# Patient Record
Sex: Female | Born: 1950 | Race: White | Hispanic: No | Marital: Married | Smoking: Never smoker
Health system: Southern US, Community
[De-identification: ages and names within clinical notes are randomized; demographics above are authoritative.]

## PROBLEM LIST (undated history)

## (undated) HISTORY — PX: ABDOMINAL HYSTERECTOMY: SHX81

---

## 2000-10-20 ENCOUNTER — Other Ambulatory Visit: Admission: RE | Admit: 2000-10-20 | Discharge: 2000-10-20 | Payer: Self-pay | Admitting: Obstetrics & Gynecology

## 2005-01-28 ENCOUNTER — Other Ambulatory Visit: Admission: RE | Admit: 2005-01-28 | Discharge: 2005-01-28 | Payer: Self-pay | Admitting: Obstetrics & Gynecology

## 2005-03-24 ENCOUNTER — Ambulatory Visit: Payer: Self-pay | Admitting: Cardiology

## 2011-10-12 ENCOUNTER — Other Ambulatory Visit: Payer: Self-pay | Admitting: Obstetrics & Gynecology

## 2011-10-12 DIAGNOSIS — R928 Other abnormal and inconclusive findings on diagnostic imaging of breast: Secondary | ICD-10-CM

## 2011-10-15 ENCOUNTER — Ambulatory Visit
Admission: RE | Admit: 2011-10-15 | Discharge: 2011-10-15 | Disposition: A | Discharge status: Home / Self Care | Payer: BC Managed Care – PPO | Source: Ambulatory Visit | Attending: Obstetrics & Gynecology | Admitting: Obstetrics & Gynecology

## 2011-10-15 DIAGNOSIS — R928 Other abnormal and inconclusive findings on diagnostic imaging of breast: Secondary | ICD-10-CM

## 2013-01-22 ENCOUNTER — Other Ambulatory Visit: Payer: Self-pay | Admitting: Obstetrics & Gynecology

## 2013-01-22 DIAGNOSIS — R928 Other abnormal and inconclusive findings on diagnostic imaging of breast: Secondary | ICD-10-CM

## 2013-01-31 ENCOUNTER — Ambulatory Visit
Admission: RE | Admit: 2013-01-31 | Discharge: 2013-01-31 | Disposition: A | Discharge status: Home / Self Care | Payer: BC Managed Care – PPO | Source: Ambulatory Visit | Attending: Obstetrics & Gynecology | Admitting: Obstetrics & Gynecology

## 2013-01-31 DIAGNOSIS — R928 Other abnormal and inconclusive findings on diagnostic imaging of breast: Secondary | ICD-10-CM

## 2016-05-26 ENCOUNTER — Other Ambulatory Visit: Payer: Self-pay | Admitting: Obstetrics & Gynecology

## 2016-05-26 DIAGNOSIS — R928 Other abnormal and inconclusive findings on diagnostic imaging of breast: Secondary | ICD-10-CM

## 2016-06-15 ENCOUNTER — Ambulatory Visit
Admission: RE | Admit: 2016-06-15 | Discharge: 2016-06-15 | Disposition: A | Discharge status: Home / Self Care | Payer: 59 | Source: Ambulatory Visit | Attending: Obstetrics & Gynecology | Admitting: Obstetrics & Gynecology

## 2016-06-15 DIAGNOSIS — R928 Other abnormal and inconclusive findings on diagnostic imaging of breast: Secondary | ICD-10-CM

## 2018-01-16 IMAGING — US ULTRASOUND RIGHT BREAST LIMITED
1 series · 5 of 5 positions shown · non-contrast
Comparison: Previous exam(s).

CLINICAL DATA: Right breast slightly upper inner quadrant focal
asymmetry seen on most recent screening mammography.

EXAM:
2D DIGITAL DIAGNOSTIC RIGHT MAMMOGRAM WITH CAD AND ADJUNCT TOMO
ULTRASOUND RIGHT BREAST

[Series 1: ultrasound right breast limited · 0.07mm/px · 5 of 5 slices shown]
[im 1/5]
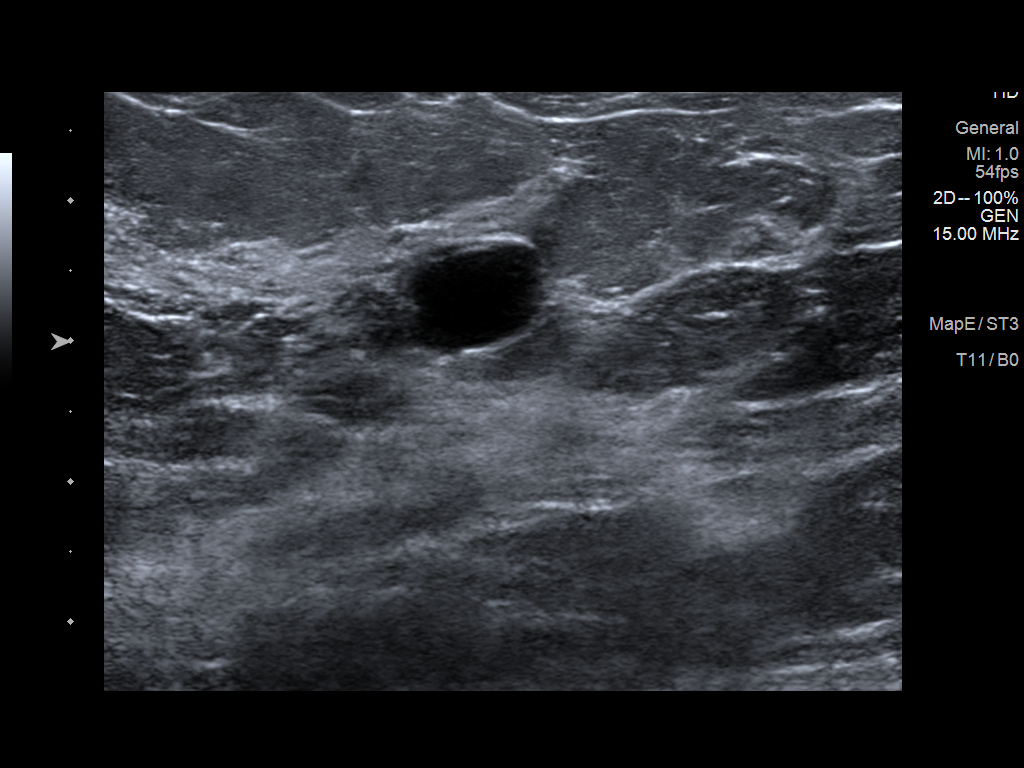
[im 2/5]
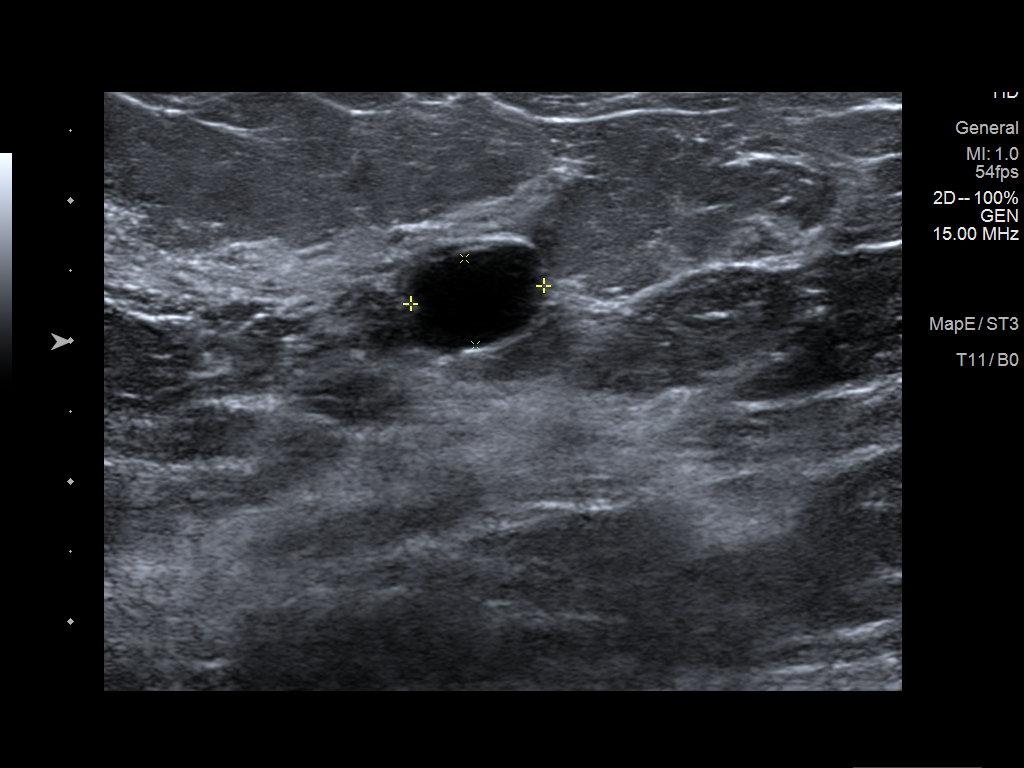
[im 3/5]
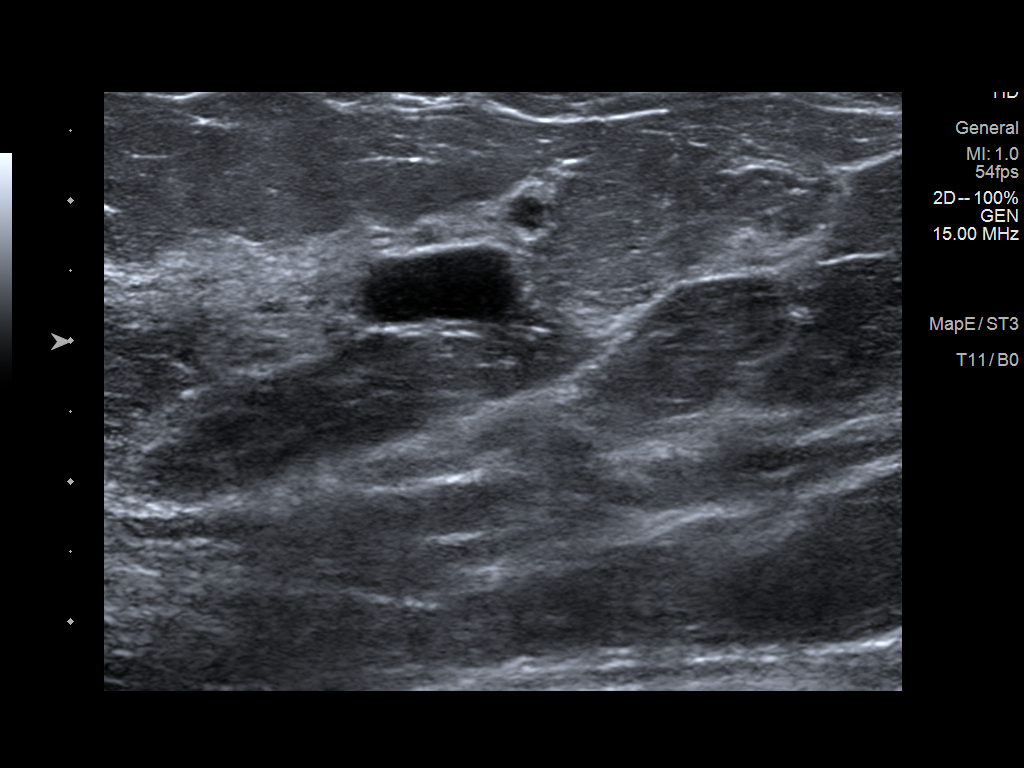
[im 4/5]
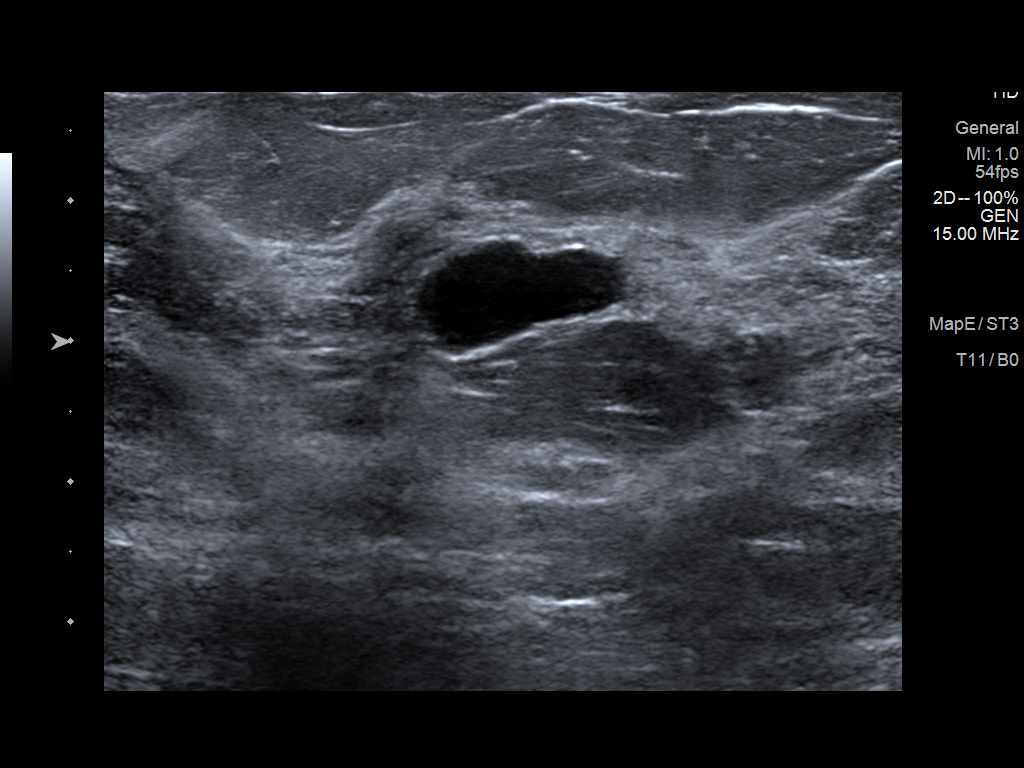
[im 5/5]
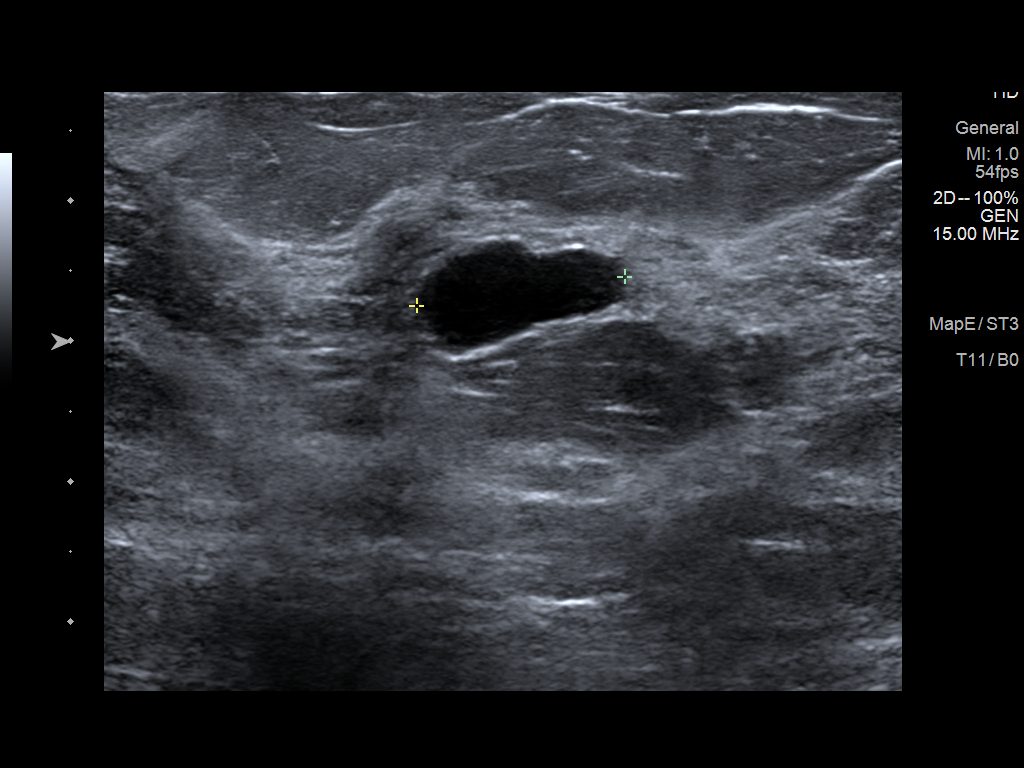

[5 of 5 positions shown; findings below may reference images not displayed]

ACR Breast Density Category c: The breast tissue is heterogeneously
dense, which may obscure small masses.
FINDINGS: Additional mammographic views of the right breast demonstrate
persistent macro lobulated circumscribed low-density mass in the
right 1 o'clock breast, middle depth. The mammographic appearance
suggestive of a benign finding.

Mammographic images were processed with CAD.

On physical exam, no suspicious masses are found.

Targeted ultrasound is performed, showing right breast 1 o'clock 2
cm from the nipple benign-appearing cyst measuring 1.5 x 1.0 x
cm. This finding is felt to correspond to the mammographically seen
mass.
IMPRESSION: No mammographic evidence of malignancy in the right breast.

Right breast 1 o'clock benign-appearing cyst.

RECOMMENDATION:
Screening mammogram in one year.(Code:JO-D-8E1)

I have discussed the findings and recommendations with the patient.
Results were also provided in writing at the conclusion of the
visit. If applicable, a reminder letter will be sent to the patient
regarding the next appointment.

BI-RADS CATEGORY  2: Benign.

## 2018-01-16 IMAGING — MG 2D DIGITAL DIAGNOSTIC UNILATERAL RIGHT MAMMOGRAM WITH CAD AND AD
5 series · 6 of 13 positions shown · non-contrast
Comparison: Previous exam(s).

CLINICAL DATA: Right breast slightly upper inner quadrant focal
asymmetry seen on most recent screening mammography.

EXAM:
2D DIGITAL DIAGNOSTIC RIGHT MAMMOGRAM WITH CAD AND ADJUNCT TOMO
ULTRASOUND RIGHT BREAST

[R CC]
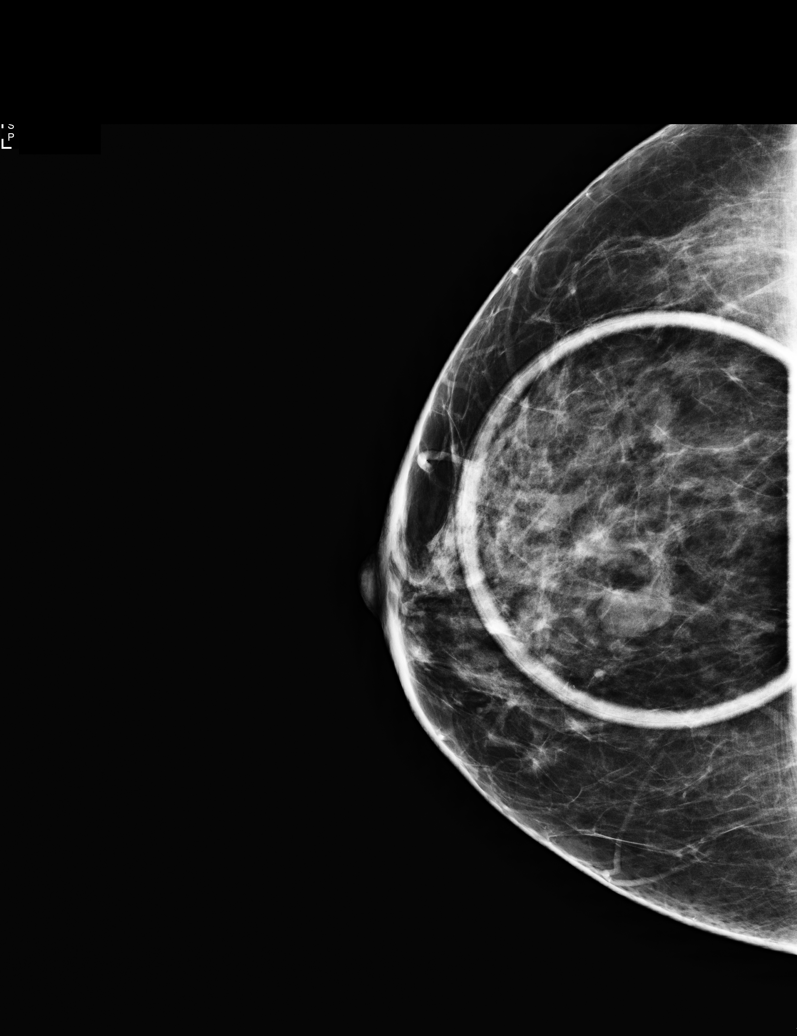

[R CC synth-2D]
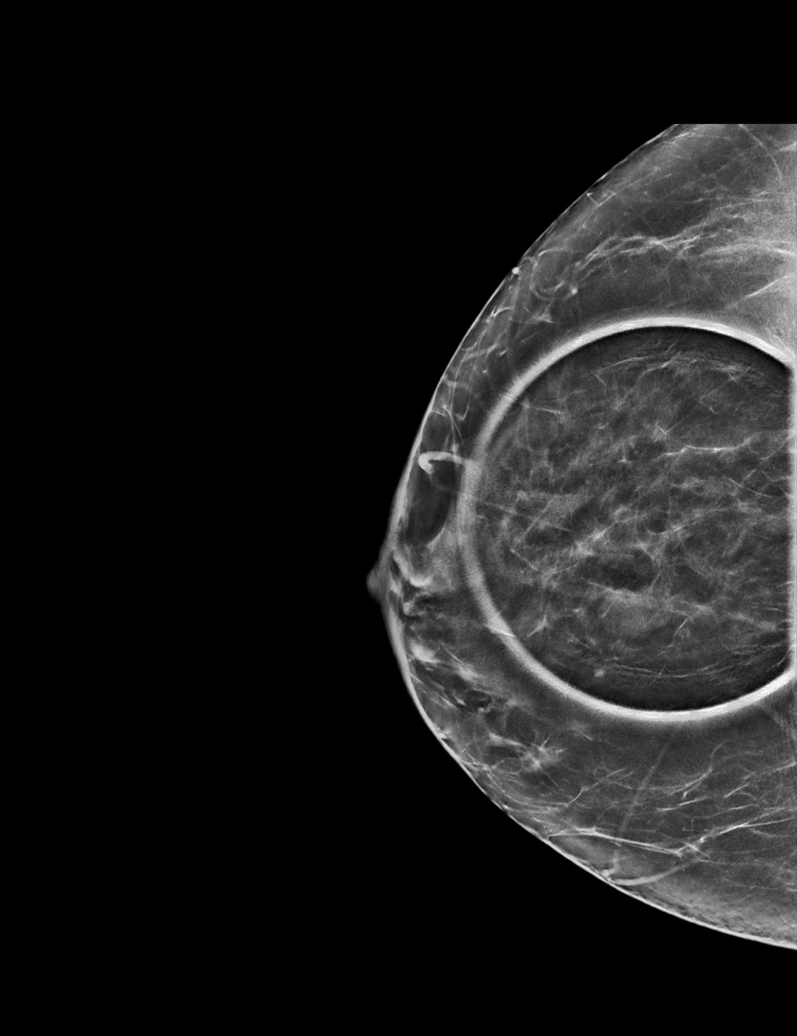

[R MLO]
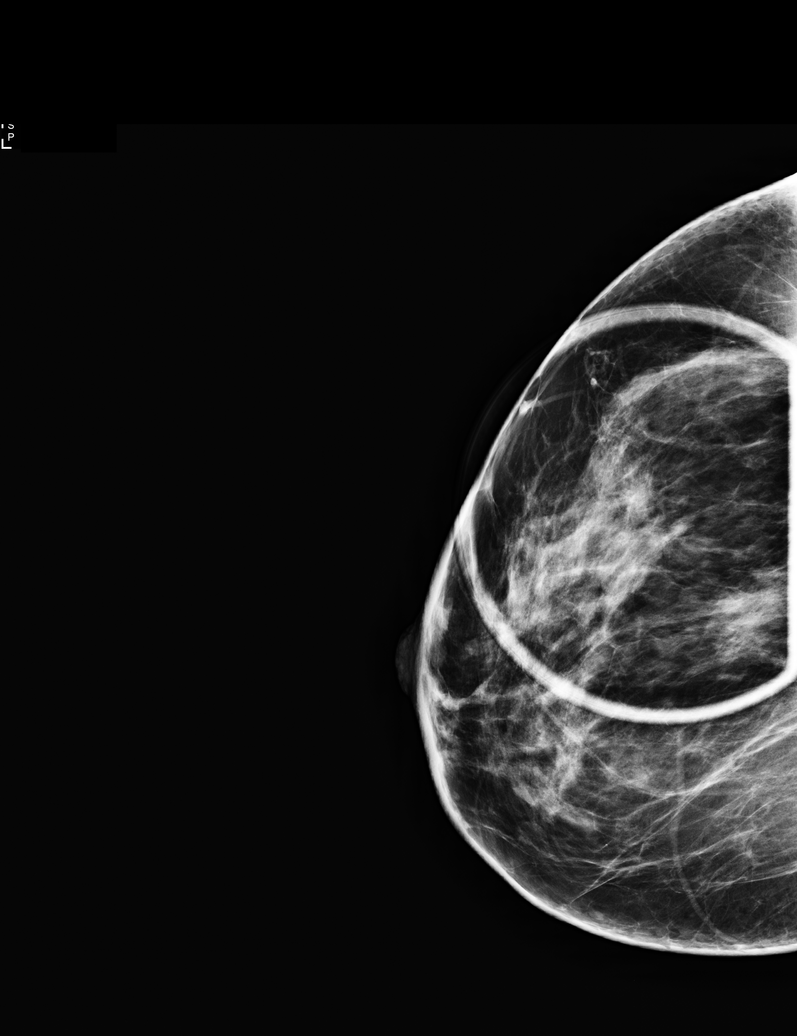

[R CC tomo · 2 of 63 frames shown]
[frame 21/63]
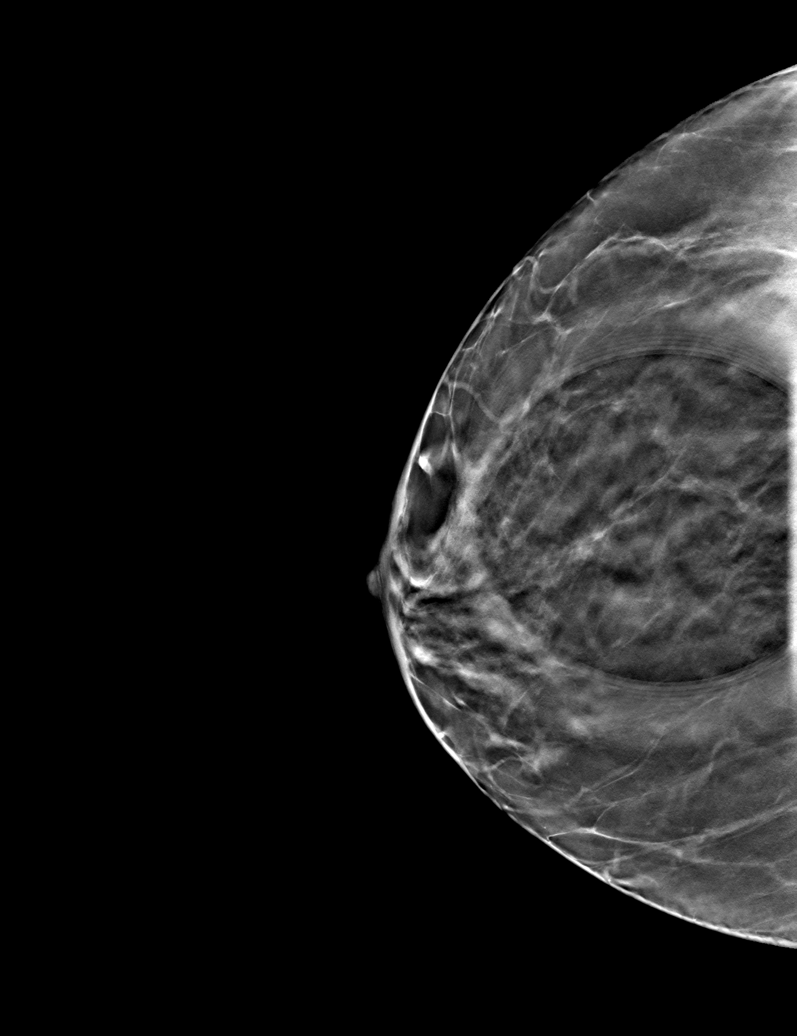
[frame 32/63]
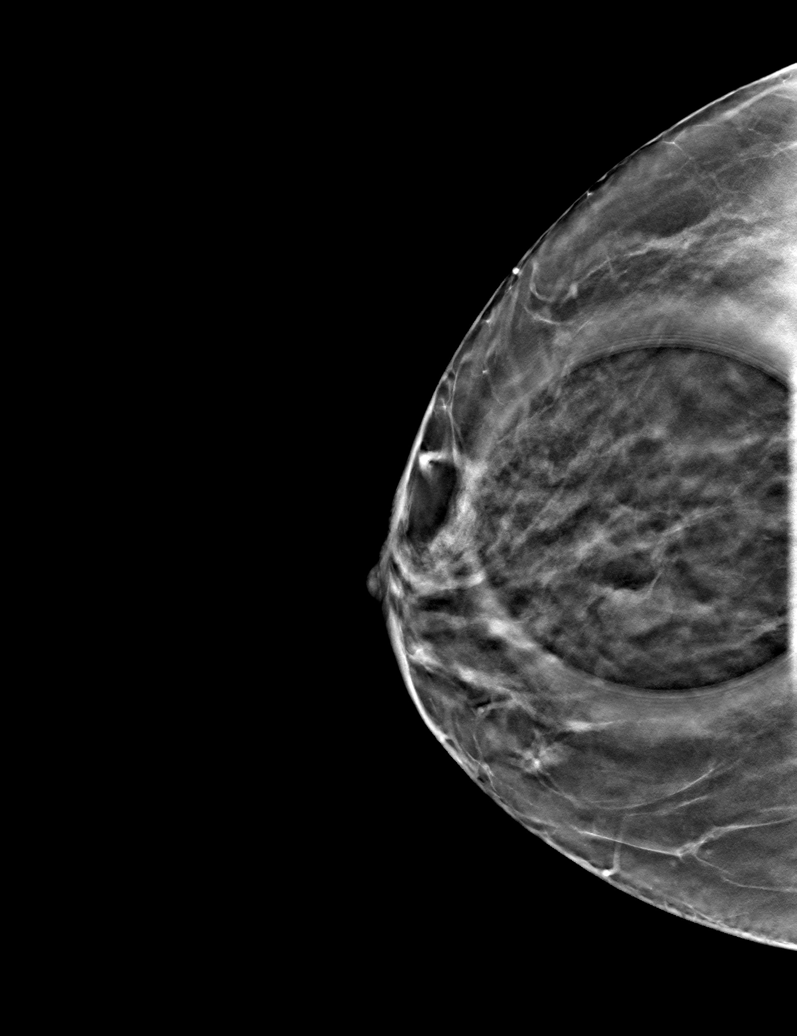

[R MLO tomo · tomo slice 35/68.0]
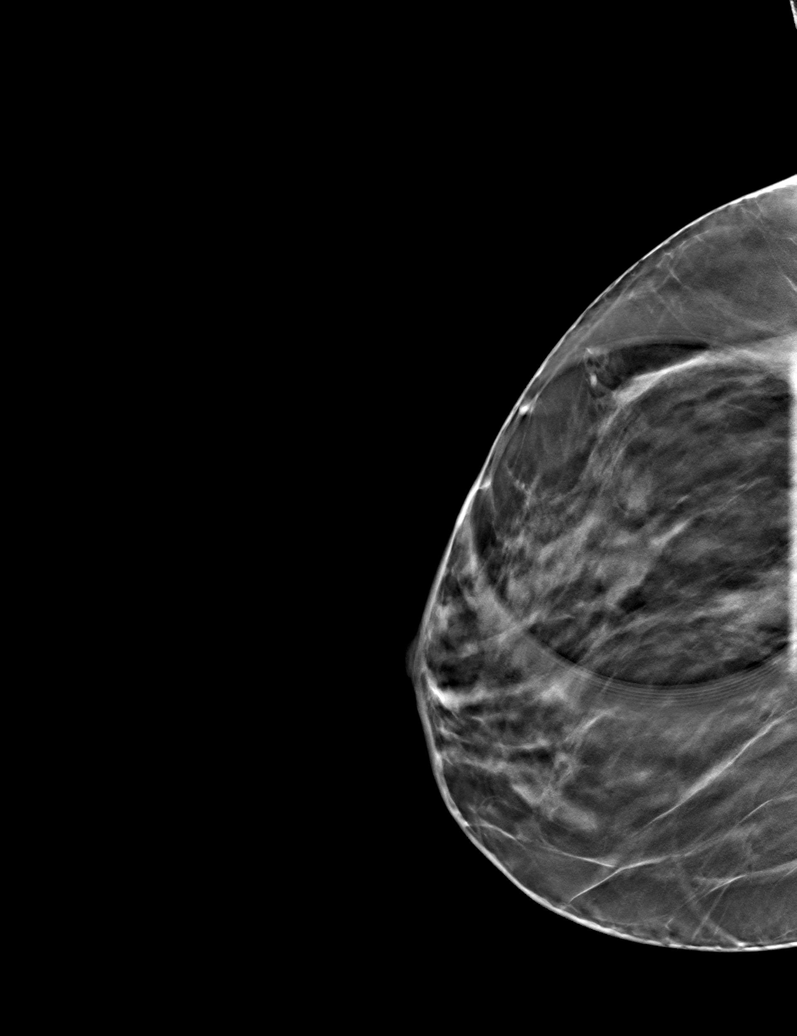

[6 of 13 positions shown; findings below may reference images not displayed]

ACR Breast Density Category c: The breast tissue is heterogeneously
dense, which may obscure small masses.
FINDINGS: Additional mammographic views of the right breast demonstrate
persistent macro lobulated circumscribed low-density mass in the
right 1 o'clock breast, middle depth. The mammographic appearance
suggestive of a benign finding.

Mammographic images were processed with CAD.

On physical exam, no suspicious masses are found.

Targeted ultrasound is performed, showing right breast 1 o'clock 2
cm from the nipple benign-appearing cyst measuring 1.5 x 1.0 x
cm. This finding is felt to correspond to the mammographically seen
mass.
IMPRESSION: No mammographic evidence of malignancy in the right breast.

Right breast 1 o'clock benign-appearing cyst.

RECOMMENDATION:
Screening mammogram in one year.(Code:JO-D-8E1)

I have discussed the findings and recommendations with the patient.
Results were also provided in writing at the conclusion of the
visit. If applicable, a reminder letter will be sent to the patient
regarding the next appointment.

BI-RADS CATEGORY  2: Benign.

## 2020-05-05 DIAGNOSIS — I10 Essential (primary) hypertension: Secondary | ICD-10-CM

## 2020-05-05 HISTORY — DX: Essential (primary) hypertension: I10

## 2020-06-03 DIAGNOSIS — I10 Essential (primary) hypertension: Secondary | ICD-10-CM | POA: Diagnosis not present

## 2020-06-03 DIAGNOSIS — R9431 Abnormal electrocardiogram [ECG] [EKG]: Secondary | ICD-10-CM | POA: Diagnosis not present

## 2021-10-14 ENCOUNTER — Ambulatory Visit: Payer: Self-pay | Admitting: Student

## 2021-12-22 DIAGNOSIS — R799 Abnormal finding of blood chemistry, unspecified: Secondary | ICD-10-CM | POA: Diagnosis not present

## 2021-12-22 DIAGNOSIS — Z131 Encounter for screening for diabetes mellitus: Secondary | ICD-10-CM | POA: Diagnosis not present

## 2021-12-22 DIAGNOSIS — L309 Dermatitis, unspecified: Secondary | ICD-10-CM | POA: Diagnosis not present

## 2021-12-22 DIAGNOSIS — R7301 Impaired fasting glucose: Secondary | ICD-10-CM | POA: Diagnosis not present

## 2021-12-22 DIAGNOSIS — I10 Essential (primary) hypertension: Secondary | ICD-10-CM | POA: Diagnosis not present

## 2021-12-22 DIAGNOSIS — E559 Vitamin D deficiency, unspecified: Secondary | ICD-10-CM | POA: Diagnosis not present

## 2021-12-22 DIAGNOSIS — E785 Hyperlipidemia, unspecified: Secondary | ICD-10-CM | POA: Diagnosis not present

## 2022-01-14 DIAGNOSIS — E785 Hyperlipidemia, unspecified: Secondary | ICD-10-CM | POA: Diagnosis not present

## 2022-01-14 DIAGNOSIS — I951 Orthostatic hypotension: Secondary | ICD-10-CM | POA: Diagnosis not present

## 2022-01-14 DIAGNOSIS — Z008 Encounter for other general examination: Secondary | ICD-10-CM | POA: Diagnosis not present

## 2022-01-14 DIAGNOSIS — Z803 Family history of malignant neoplasm of breast: Secondary | ICD-10-CM | POA: Diagnosis not present

## 2022-01-14 DIAGNOSIS — R32 Unspecified urinary incontinence: Secondary | ICD-10-CM | POA: Diagnosis not present

## 2022-01-14 DIAGNOSIS — I10 Essential (primary) hypertension: Secondary | ICD-10-CM | POA: Diagnosis not present

## 2022-01-14 DIAGNOSIS — Z7982 Long term (current) use of aspirin: Secondary | ICD-10-CM | POA: Diagnosis not present

## 2022-01-14 DIAGNOSIS — Z8249 Family history of ischemic heart disease and other diseases of the circulatory system: Secondary | ICD-10-CM | POA: Diagnosis not present

## 2022-02-23 DIAGNOSIS — J111 Influenza due to unidentified influenza virus with other respiratory manifestations: Secondary | ICD-10-CM | POA: Diagnosis not present

## 2022-03-29 DIAGNOSIS — Z1212 Encounter for screening for malignant neoplasm of rectum: Secondary | ICD-10-CM | POA: Diagnosis not present

## 2022-03-29 DIAGNOSIS — Z1211 Encounter for screening for malignant neoplasm of colon: Secondary | ICD-10-CM | POA: Diagnosis not present

## 2022-04-07 LAB — COLOGUARD: COLOGUARD: POSITIVE — AB

## 2022-04-16 DIAGNOSIS — R195 Other fecal abnormalities: Secondary | ICD-10-CM | POA: Diagnosis not present

## 2022-05-14 DIAGNOSIS — R195 Other fecal abnormalities: Secondary | ICD-10-CM | POA: Diagnosis not present

## 2022-05-14 DIAGNOSIS — Z1211 Encounter for screening for malignant neoplasm of colon: Secondary | ICD-10-CM | POA: Diagnosis not present

## 2022-08-12 DIAGNOSIS — Z13228 Encounter for screening for other metabolic disorders: Secondary | ICD-10-CM | POA: Diagnosis not present

## 2022-08-12 DIAGNOSIS — R9431 Abnormal electrocardiogram [ECG] [EKG]: Secondary | ICD-10-CM | POA: Diagnosis not present

## 2022-08-12 DIAGNOSIS — H6122 Impacted cerumen, left ear: Secondary | ICD-10-CM | POA: Diagnosis not present

## 2022-08-12 DIAGNOSIS — I10 Essential (primary) hypertension: Secondary | ICD-10-CM | POA: Diagnosis not present

## 2022-08-12 DIAGNOSIS — I8393 Asymptomatic varicose veins of bilateral lower extremities: Secondary | ICD-10-CM | POA: Diagnosis not present

## 2022-08-12 DIAGNOSIS — Z0001 Encounter for general adult medical examination with abnormal findings: Secondary | ICD-10-CM | POA: Diagnosis not present

## 2022-08-12 DIAGNOSIS — Z1321 Encounter for screening for nutritional disorder: Secondary | ICD-10-CM | POA: Diagnosis not present

## 2022-08-12 DIAGNOSIS — Z136 Encounter for screening for cardiovascular disorders: Secondary | ICD-10-CM | POA: Diagnosis not present

## 2022-08-12 DIAGNOSIS — E785 Hyperlipidemia, unspecified: Secondary | ICD-10-CM | POA: Diagnosis not present

## 2022-08-12 DIAGNOSIS — E559 Vitamin D deficiency, unspecified: Secondary | ICD-10-CM | POA: Diagnosis not present

## 2022-08-17 DIAGNOSIS — R9431 Abnormal electrocardiogram [ECG] [EKG]: Secondary | ICD-10-CM | POA: Diagnosis not present

## 2022-08-17 DIAGNOSIS — I10 Essential (primary) hypertension: Secondary | ICD-10-CM | POA: Diagnosis not present

## 2022-08-26 ENCOUNTER — Other Ambulatory Visit: Payer: Self-pay

## 2022-08-27 ENCOUNTER — Encounter: Payer: Self-pay | Admitting: Cardiology

## 2022-08-27 ENCOUNTER — Ambulatory Visit: Payer: Medicare HMO | Attending: Cardiology | Admitting: Cardiology

## 2022-08-27 VITALS — BP 128/70 | HR 93 | Ht 68.0 in | Wt 172.4 lb

## 2022-08-27 DIAGNOSIS — E785 Hyperlipidemia, unspecified: Secondary | ICD-10-CM | POA: Diagnosis not present

## 2022-08-27 DIAGNOSIS — I10 Essential (primary) hypertension: Secondary | ICD-10-CM

## 2022-08-27 DIAGNOSIS — R9431 Abnormal electrocardiogram [ECG] [EKG]: Secondary | ICD-10-CM | POA: Insufficient documentation

## 2022-08-27 DIAGNOSIS — I351 Nonrheumatic aortic (valve) insufficiency: Secondary | ICD-10-CM | POA: Insufficient documentation

## 2022-08-27 DIAGNOSIS — Z8249 Family history of ischemic heart disease and other diseases of the circulatory system: Secondary | ICD-10-CM | POA: Diagnosis not present

## 2022-08-27 DIAGNOSIS — I6521 Occlusion and stenosis of right carotid artery: Secondary | ICD-10-CM | POA: Diagnosis not present

## 2022-08-27 DIAGNOSIS — I779 Disorder of arteries and arterioles, unspecified: Secondary | ICD-10-CM

## 2022-08-27 HISTORY — DX: Disorder of arteries and arterioles, unspecified: I77.9

## 2022-08-27 HISTORY — DX: Abnormal electrocardiogram (ECG) (EKG): R94.31

## 2022-08-27 HISTORY — DX: Nonrheumatic aortic (valve) insufficiency: I35.1

## 2022-08-27 MED ORDER — ATORVASTATIN CALCIUM 20 MG PO TABS: 20.0000 mg | ORAL_TABLET | Freq: Every day | ORAL | 3 refills | Status: DC

## 2022-08-27 NOTE — Patient Instructions (Signed)
Medication Instructions:  Your physician has recommended you make the following change in your medication:   INCREASE:  Lipitor 20mg  1 tablet daily- You may double your current dose then your next refill will reflect your new dose.     Lab Work: Your physician recommends that you return for lab work in: 6 weeks You need to have labs done when you are fasting.  You can come Monday through Friday 8:30 am to 12:00 pm and 1:15 to 4:30. You do not need to make an appointment as the order has already been placed. The labs you are going to have done are AST, ALT Lipids.    Testing/Procedures: We will order CT coronary calcium score. It will cost $99.00 and is not covered by insurance.  Please call to schedule.     [facility] [address] [place], [place] [address] [phone]  -- Call to schedule    Follow-Up: At [facility], you and your health needs are our priority.  As part of our continuing mission to provide you with exceptional heart care, we have created designated Provider Care Teams.  These Care Teams include your primary Cardiologist (physician) and Advanced Practice Providers (APPs -  Physician Assistants and Nurse Practitioners) who all work together to provide you with the care you need, when you need it.  We recommend signing up for the patient portal called "MyChart".  Sign up information is provided on this After Visit Summary.  MyChart is used to connect with patients for Virtual Visits (Telemedicine).  Patients are able to view lab/test results, encounter notes, upcoming appointments, etc.  Non-urgent messages can be sent to your provider as well.   To learn more about what you can do with MyChart, go to [email or web address].    Your next appointment:   3 month(s)  The format for your next appointment:   In Person  Provider:   Jenne Campus, MD    Other Instructions NA

## 2022-08-27 NOTE — Progress Notes (Signed)
Cardiology Consultation:    Date:  08/27/2022   ID:  Janice Randolph, DOB December 15, 1950, MRN [id number]  PCP:  Erskine Emery, NP  Cardiologist:  Gypsy Balsam, MD   Referring MD: Lars Mage, NP   Chief Complaint  Patient presents with   Abnormal ECG   Abnormal [place]    History of Present Illness:    Janice Randolph is a 71 y.o. female who is being seen today for the evaluation of abnormal EKG, abnormal echocardiogram, abnormal cardiac ultrasound at the request of Tetter, Devin B, NP.  She went to her primary care physician for regular checkup.  Supportive evaluation she had cardiac ultrasound done which showed up to 69% stenosis on the right side, she also had echocardiogram performed which showed preserved left ventricle ejection fraction without segmental motion of normalities, no significant valvular pathology, will treat if that echocardiogram is the fact that she had an EKG done which showed possibility of anterior wall MI as well as possibility of inferior wall MI.  Also few years ago she 2 years ago she was told to have a stroke I plan to get CT of her head done after some strength events she was told to have a small stroke.  At that time she was put on statin as well as aspirin.  Overall she is doing very well.  She can walk climb stairs with no difficulties she denies having any chest pain tightness squeezing pressure burning chest no palpitations no dizziness no swelling of lower extremities.  She is not on any special diet and she admits that she needs it will be more exercises compared to what she does right now  History reviewed. No pertinent past medical history.  History reviewed. No pertinent surgical history.  Current Medications: Current Meds  Medication Sig   amLODipine (NORVASC) 2.5 MG tablet Take 2.5 mg by mouth daily.   aspirin (ASPIRIN ADULT) 325 MG tablet Take 325 mg by mouth daily.   atorvastatin (LIPITOR) 20 MG tablet Take 1 tablet (20 mg total) by mouth  daily.   hydrochlorothiazide (HYDRODIURIL) 25 MG tablet Take 25 mg by mouth daily.   [DISCONTINUED] aspirin 81 MG chewable tablet Chew 81 mg by mouth daily.   [DISCONTINUED] atorvastatin (LIPITOR) 10 MG tablet Take 10 mg by mouth daily.   [DISCONTINUED] estradiol (ESTRACE) 1 MG tablet Take 1 mg by mouth daily.     Allergies:   Patient has no known allergies.   Social History   Socioeconomic History   Marital status: Married    Spouse name: Not on file   Number of children: Not on file   Years of education: Not on file   Highest education level: Not on file  Occupational History   Not on file  Tobacco Use   Smoking status: Not on file   Smokeless tobacco: Not on file  Substance and Sexual Activity   Alcohol use: Not on file   Drug use: Not on file   Sexual activity: Not on file  Other Topics Concern   Not on file  Social History Narrative   Not on file   Social Determinants of Health   Financial Resource Strain: Not on file  Food Insecurity: Not on file  Transportation Needs: Not on file  Physical Activity: Not on file  Stress: Not on file  Social Connections: Not on file     Family History: The patient's family history is not on file. ROS:   Please see  the history of present illness.    All 14 point review of systems negative except as described per history of present illness.  EKGs/Labs/Other Studies Reviewed:    The following studies were reviewed today: Echocardiogram done by her primary care physician show mild aortic insufficiency mild tricuspid insufficiency with normal pulm artery pressure, normal left ventricle ejection fraction.  No segmental wall motion abnormalities  EKG:  EKG is  ordered today.  The ekg ordered today demonstrates normal sinus rhythm, normal P interval, Q wave inferiorly as well as poor R wave progression anterior precordium.  Recent Labs: No results found for requested labs within last 365 days.  Recent Lipid Panel No results found  for: "CHOL", "TRIG", "HDL", "CHOLHDL", "VLDL", "LDLCALC", "LDLDIRECT"  Physical Exam:    VS:  BP 128/70 (BP Location: Left Arm, Patient Position: Sitting)   Pulse 93   Ht 5\' 8"  (1.727 m)   Wt 172 lb 6.4 oz (78.2 kg)   SpO2 97%   BMI 26.21 kg/m     Wt Readings from Last 3 Encounters:  08/27/22 172 lb 6.4 oz (78.2 kg)     GEN:  Well nourished, well developed in no acute distress HEENT: Normal NECK: No JVD; No carotid bruits LYMPHATICS: No lymphadenopathy CARDIAC: RRR, no murmurs, no rubs, no gallops RESPIRATORY:  Clear to auscultation without rales, wheezing or rhonchi  ABDOMEN: Soft, non-tender, non-distended MUSCULOSKELETAL:  No edema; No deformity  SKIN: Warm and dry NEUROLOGIC:  Alert and oriented x 3 PSYCHIATRIC:  Normal affect   ASSESSMENT:    1. Essential hypertension   2. Dyslipidemia   3. Family history of early CAD   4. Stenosis of right carotid artery   5. Nonrheumatic aortic valve insufficiency   6. Nonspecific abnormal electrocardiogram (ECG) (EKG)    PLAN:    In order of problems listed above:  Abnormal EKG raising suspicion for inferior wall MI.  I did do a quick look echocardiogram to verify it function especially of the inferior wall since look like she may have had inferior wall MI, clearly her left ventricle ejection fraction is normal with the well-visualized inferior wall having no segmental wall motion abnormality therefore I think we can conclude that she did not have myocardial infarction.  I think it would be reasonable to continue antiplatelets therapy considering the fact that she does have peripheral vascular disease and also investigate if her coronary artery do have any calcifications, therefore, she will be scheduled to have calcium score. Dyslipidemia she is taking Lipitor 10.  Her LDL is 71 because of carotic arterial disease I will increase dose of Lipitor to 20 mg she will have her fasting lipid profile done in about 6 weeks. Nonrheumatic  aortic valve insufficiency which is only very mild and not a concern at this stage. We did talk about healthy lifestyle need to exercise on the regular basis and good diet which she understands need to do. Essential hypertension blood pressure looks good   Medication Adjustments/Labs and Tests Ordered: Current medicines are reviewed at length with the patient today.  Concerns regarding medicines are outlined above.  Orders Placed This Encounter  Procedures   CT CARDIAC SCORING (SELF PAY ONLY)   ALT   AST   Lipid panel   EKG 12-Lead   Meds ordered this encounter  Medications   atorvastatin (LIPITOR) 20 MG tablet    Sig: Take 1 tablet (20 mg total) by mouth daily.    Dispense:  90 tablet  Refill:  3    Signed, Park Liter, MD, [facility]. 08/27/2022 5:17 PM    [place]

## 2022-09-03 ENCOUNTER — Ambulatory Visit (HOSPITAL_BASED_OUTPATIENT_CLINIC_OR_DEPARTMENT_OTHER)
Admission: RE | Admit: 2022-09-03 | Discharge: 2022-09-03 | Disposition: A | Discharge status: Home / Self Care | Payer: Medicare HMO | Source: Ambulatory Visit | Attending: Cardiology | Admitting: Cardiology

## 2022-09-03 DIAGNOSIS — Z8249 Family history of ischemic heart disease and other diseases of the circulatory system: Secondary | ICD-10-CM | POA: Insufficient documentation

## 2022-09-16 DIAGNOSIS — I6529 Occlusion and stenosis of unspecified carotid artery: Secondary | ICD-10-CM | POA: Diagnosis not present

## 2022-09-16 DIAGNOSIS — Z1331 Encounter for screening for depression: Secondary | ICD-10-CM | POA: Diagnosis not present

## 2022-09-16 DIAGNOSIS — I10 Essential (primary) hypertension: Secondary | ICD-10-CM | POA: Diagnosis not present

## 2022-09-16 DIAGNOSIS — Z0001 Encounter for general adult medical examination with abnormal findings: Secondary | ICD-10-CM | POA: Diagnosis not present

## 2022-09-16 DIAGNOSIS — Z8673 Personal history of transient ischemic attack (TIA), and cerebral infarction without residual deficits: Secondary | ICD-10-CM | POA: Diagnosis not present

## 2022-09-16 DIAGNOSIS — Z789 Other specified health status: Secondary | ICD-10-CM | POA: Diagnosis not present

## 2022-11-25 DIAGNOSIS — E785 Hyperlipidemia, unspecified: Secondary | ICD-10-CM | POA: Diagnosis not present

## 2022-11-25 LAB — LIPID PANEL
Chol/HDL Ratio: 3.5 ratio (ref 0.0–4.4)
Cholesterol, Total: 137 mg/dL (ref 100–199)
HDL: 39 mg/dL — ABNORMAL LOW (ref >39)
LDL Chol Calc (NIH): 66 mg/dL (ref 0–99)
Triglycerides: 193 mg/dL — ABNORMAL HIGH (ref 0–149)
VLDL Cholesterol Cal: 32 mg/dL (ref 5–40)

## 2022-11-25 LAB — ALT: ALT: 17 IU/L (ref 0–32)

## 2022-11-25 LAB — AST: AST: 15 IU/L (ref 0–40)

## 2022-11-30 ENCOUNTER — Encounter: Payer: Self-pay | Admitting: Cardiology

## 2022-11-30 ENCOUNTER — Telehealth: Payer: Self-pay

## 2022-11-30 ENCOUNTER — Ambulatory Visit: Payer: Medicare HMO | Attending: Cardiology | Admitting: Cardiology

## 2022-11-30 VITALS — BP 140/72 | HR 77 | Ht 68.0 in | Wt 172.0 lb

## 2022-11-30 DIAGNOSIS — I10 Essential (primary) hypertension: Secondary | ICD-10-CM | POA: Diagnosis not present

## 2022-11-30 DIAGNOSIS — I351 Nonrheumatic aortic (valve) insufficiency: Secondary | ICD-10-CM | POA: Diagnosis not present

## 2022-11-30 DIAGNOSIS — I6521 Occlusion and stenosis of right carotid artery: Secondary | ICD-10-CM | POA: Diagnosis not present

## 2022-11-30 DIAGNOSIS — R9431 Abnormal electrocardiogram [ECG] [EKG]: Secondary | ICD-10-CM | POA: Diagnosis not present

## 2022-11-30 NOTE — Patient Instructions (Signed)

## 2022-11-30 NOTE — Progress Notes (Addendum)
Cardiology Office Note:    Date:  11/30/2022   ID:  Janice Randolph, DOB [birth year], MRN [id number]  PCP:  Rhea Bleacher, NP  Cardiologist:  Jenne Campus, MD    Referring MD: Rhea Bleacher, NP   Chief Complaint  Patient presents with   Follow-up    History of Present Illness:    Janice Randolph is a 72 y.o. female    History reviewed. 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  She does have essential hypertension, dyslipidemia.  She never smoked.  She did have echocardiogram done which showed preserved left ventricle ejection fraction without segmental wall motion abnormalities.  She did have a calcium score done which is 0 surprisingly she was find to have some pulmonary nodules. She comes today to months for follow-up.  Overall doing very well.  She is asymptomatic.  She denies of any chest pain tightness squeezing pressure burning chest no palpitation dizziness overall doing very well but admits that she is not as active as she is doing the summer because of cold weather.   Past Surgical History:  Procedure Laterality Date   ABDOMINAL HYSTERECTOMY      Current Medications: Current Meds  Medication Sig   amLODipine (NORVASC) 2.5 MG tablet Take 2.5 mg by mouth daily.   aspirin (ASPIRIN ADULT) 325 MG tablet Take 325 mg by mouth daily.   atorvastatin (LIPITOR) 20 MG tablet Take 1 tablet (20 mg total) by mouth daily.   hydrochlorothiazide (HYDRODIURIL) 25 MG tablet Take 25 mg by mouth daily.   [DISCONTINUED] atorvastatin (LIPITOR) 10 MG tablet Take 10 mg by mouth daily.     Allergies:   Lactose intolerance (gi)   Social History   Socioeconomic History   Marital status: Married    Spouse name: Not on file   Number of children: Not on file   Years of education: Not on  file   Highest education level: Not on file  Occupational History   Not on file  Tobacco Use   Smoking status: Never   Smokeless tobacco: Never  Substance and Sexual Activity   Alcohol use: Never   Drug use: Never   Sexual activity: Not Currently  Other Topics Concern   Not on file  Social History Narrative   Not on file   Social Determinants of Health   Financial Resource Strain: Not on file  Food Insecurity: Not on file  Transportation Needs: Not on file  Physical Activity: Not on file  Stress: Not on file  Social Connections: Not on file     Family History: The patient's family history is not on file. ROS:   Please see the history of present illness.    All 14 point review of systems negative except as described per history of present illness  EKGs/Labs/Other Studies Reviewed:      Recent Labs: 11/25/2022: ALT 17  Recent Lipid Panel    Component Value Date/Time   CHOL 137 11/25/2022 1023   TRIG 193 (H) 11/25/2022 1023   HDL 39 (L) 11/25/2022 1023   CHOLHDL 3.5 11/25/2022 1023   LDLCALC 66 11/25/2022 1023    Physical Exam:    VS:  BP (!) 140/72 (BP Location: Left Arm, Patient Position: Sitting)   Pulse 77   Ht 5\' 8"  (1.727 m)  Wt 172 lb (78 kg)   SpO2 97%   BMI 26.15 kg/m     Wt Readings from Last 3 Encounters:  11/30/22 172 lb (78 kg)  08/27/22 172 lb 6.4 oz (78.2 kg)     GEN:  Well nourished, well developed in no acute distress HEENT: Normal NECK: No JVD; No carotid bruits LYMPHATICS: No lymphadenopathy CARDIAC: RRR, no murmurs, no rubs, no gallops RESPIRATORY:  Clear to auscultation without rales, wheezing or rhonchi  ABDOMEN: Soft, non-tender, non-distended MUSCULOSKELETAL:  No edema; No deformity  SKIN: Warm and dry LOWER EXTREMITIES: no swelling NEUROLOGIC:  Alert and oriented x 3 PSYCHIATRIC:  Normal affect   ASSESSMENT:    1. Stenosis of right carotid artery   2. Essential hypertension   3. Nonspecific abnormal  electrocardiogram (ECG) (EKG)   4. Nonrheumatic aortic valve insufficiency    PLAN:    In order of problems listed above:  Carotic arterial stenosis.  She is on antiplatelet therapy however she takes 325 mg of aspirin, I asked her to reduce to only 81 mg daily. Essential hypertension blood pressure mildly elevated today in the office.  She does have blood pressure monitor but does not check on the regular basis, ask her to do that Atherosclerosis, we did calcium score trying to determine how aggressive we need to be with cholesterol management surprisingly it is 0. Dyslipidemia she is taking Lipitor 20 which I will continue, her last lipid profile done 5 days ago show HDL of 39 LDL 66 with a good cholesterol profile we will continue present management. We did talk about healthy lifestyle need to exercise on the regular basis as well as good diet. Pulmonary nodule noted on the CT.  He if this is slowly extended.  No need to follow-up.  She never smoked however she said that she was around people who smoke a lot.  Therefore most likely will repeat CT of her chest ER for now   Medication Adjustments/Labs and Tests Ordered: Current medicines are reviewed at length with the patient today.  Concerns regarding medicines are outlined above.  No orders of the defined types were placed in this encounter.  Medication changes: No orders of the defined types were placed in this encounter.   Signed, Park Liter, MD, [facility] 11/30/2022 9:47 AM    [place]

## 2022-11-30 NOTE — Telephone Encounter (Signed)
Results reviewed with pt as per Dr. Krasowski's note.  Pt verbalized understanding and had no additional questions. Routed to PCP  

## 2022-12-17 DIAGNOSIS — Z131 Encounter for screening for diabetes mellitus: Secondary | ICD-10-CM | POA: Diagnosis not present

## 2022-12-17 DIAGNOSIS — E785 Hyperlipidemia, unspecified: Secondary | ICD-10-CM | POA: Diagnosis not present

## 2022-12-17 DIAGNOSIS — R051 Acute cough: Secondary | ICD-10-CM | POA: Diagnosis not present

## 2022-12-17 DIAGNOSIS — Z8673 Personal history of transient ischemic attack (TIA), and cerebral infarction without residual deficits: Secondary | ICD-10-CM | POA: Diagnosis not present

## 2022-12-17 DIAGNOSIS — I6529 Occlusion and stenosis of unspecified carotid artery: Secondary | ICD-10-CM | POA: Diagnosis not present

## 2022-12-17 DIAGNOSIS — Z1321 Encounter for screening for nutritional disorder: Secondary | ICD-10-CM | POA: Diagnosis not present

## 2022-12-17 DIAGNOSIS — Z13228 Encounter for screening for other metabolic disorders: Secondary | ICD-10-CM | POA: Diagnosis not present

## 2022-12-17 DIAGNOSIS — I10 Essential (primary) hypertension: Secondary | ICD-10-CM | POA: Diagnosis not present

## 2022-12-24 DIAGNOSIS — Z20828 Contact with and (suspected) exposure to other viral communicable diseases: Secondary | ICD-10-CM | POA: Diagnosis not present

## 2022-12-24 DIAGNOSIS — Z112 Encounter for screening for other bacterial diseases: Secondary | ICD-10-CM | POA: Diagnosis not present

## 2022-12-24 DIAGNOSIS — J039 Acute tonsillitis, unspecified: Secondary | ICD-10-CM | POA: Diagnosis not present

## 2022-12-24 DIAGNOSIS — Z6826 Body mass index (BMI) 26.0-26.9, adult: Secondary | ICD-10-CM | POA: Diagnosis not present

## 2023-04-26 DIAGNOSIS — N181 Chronic kidney disease, stage 1: Secondary | ICD-10-CM | POA: Diagnosis not present

## 2023-04-26 DIAGNOSIS — Z809 Family history of malignant neoplasm, unspecified: Secondary | ICD-10-CM | POA: Diagnosis not present

## 2023-04-26 DIAGNOSIS — E785 Hyperlipidemia, unspecified: Secondary | ICD-10-CM | POA: Diagnosis not present

## 2023-04-26 DIAGNOSIS — Z823 Family history of stroke: Secondary | ICD-10-CM | POA: Diagnosis not present

## 2023-04-26 DIAGNOSIS — Z8673 Personal history of transient ischemic attack (TIA), and cerebral infarction without residual deficits: Secondary | ICD-10-CM | POA: Diagnosis not present

## 2023-04-26 DIAGNOSIS — Z8249 Family history of ischemic heart disease and other diseases of the circulatory system: Secondary | ICD-10-CM | POA: Diagnosis not present

## 2023-04-26 DIAGNOSIS — I129 Hypertensive chronic kidney disease with stage 1 through stage 4 chronic kidney disease, or unspecified chronic kidney disease: Secondary | ICD-10-CM | POA: Diagnosis not present

## 2023-04-26 DIAGNOSIS — Z833 Family history of diabetes mellitus: Secondary | ICD-10-CM | POA: Diagnosis not present

## 2023-04-26 DIAGNOSIS — R32 Unspecified urinary incontinence: Secondary | ICD-10-CM | POA: Diagnosis not present

## 2023-04-26 DIAGNOSIS — Z91011 Allergy to milk products: Secondary | ICD-10-CM | POA: Diagnosis not present

## 2023-04-26 DIAGNOSIS — Z7982 Long term (current) use of aspirin: Secondary | ICD-10-CM | POA: Diagnosis not present

## 2023-06-17 DIAGNOSIS — I6529 Occlusion and stenosis of unspecified carotid artery: Secondary | ICD-10-CM | POA: Diagnosis not present

## 2023-06-17 DIAGNOSIS — R21 Rash and other nonspecific skin eruption: Secondary | ICD-10-CM | POA: Diagnosis not present

## 2023-06-17 DIAGNOSIS — I10 Essential (primary) hypertension: Secondary | ICD-10-CM | POA: Diagnosis not present

## 2023-06-17 DIAGNOSIS — Z79890 Hormone replacement therapy (postmenopausal): Secondary | ICD-10-CM | POA: Diagnosis not present

## 2023-06-17 DIAGNOSIS — Z1321 Encounter for screening for nutritional disorder: Secondary | ICD-10-CM | POA: Diagnosis not present

## 2023-06-17 DIAGNOSIS — E559 Vitamin D deficiency, unspecified: Secondary | ICD-10-CM | POA: Diagnosis not present

## 2023-06-17 DIAGNOSIS — Z13228 Encounter for screening for other metabolic disorders: Secondary | ICD-10-CM | POA: Diagnosis not present

## 2023-06-17 DIAGNOSIS — D519 Vitamin B12 deficiency anemia, unspecified: Secondary | ICD-10-CM | POA: Diagnosis not present

## 2023-06-17 DIAGNOSIS — Z8673 Personal history of transient ischemic attack (TIA), and cerebral infarction without residual deficits: Secondary | ICD-10-CM | POA: Diagnosis not present

## 2023-06-17 DIAGNOSIS — Z131 Encounter for screening for diabetes mellitus: Secondary | ICD-10-CM | POA: Diagnosis not present

## 2023-06-17 DIAGNOSIS — E785 Hyperlipidemia, unspecified: Secondary | ICD-10-CM | POA: Diagnosis not present

## 2023-08-12 DIAGNOSIS — N959 Unspecified menopausal and perimenopausal disorder: Secondary | ICD-10-CM | POA: Diagnosis not present

## 2023-08-12 DIAGNOSIS — Z1231 Encounter for screening mammogram for malignant neoplasm of breast: Secondary | ICD-10-CM | POA: Diagnosis not present

## 2023-08-19 ENCOUNTER — Other Ambulatory Visit: Payer: Self-pay | Admitting: Cardiology

## 2023-09-15 DIAGNOSIS — Z1159 Encounter for screening for other viral diseases: Secondary | ICD-10-CM | POA: Diagnosis not present

## 2023-09-15 DIAGNOSIS — Z23 Encounter for immunization: Secondary | ICD-10-CM | POA: Diagnosis not present

## 2023-09-15 DIAGNOSIS — E559 Vitamin D deficiency, unspecified: Secondary | ICD-10-CM | POA: Diagnosis not present

## 2023-09-15 DIAGNOSIS — Z789 Other specified health status: Secondary | ICD-10-CM | POA: Diagnosis not present

## 2023-10-11 DIAGNOSIS — M1712 Unilateral primary osteoarthritis, left knee: Secondary | ICD-10-CM | POA: Diagnosis not present

## 2023-10-11 DIAGNOSIS — M1711 Unilateral primary osteoarthritis, right knee: Secondary | ICD-10-CM | POA: Diagnosis not present

## 2023-10-31 ENCOUNTER — Ambulatory Visit: Payer: Medicare HMO | Attending: Cardiology | Admitting: Cardiology

## 2023-10-31 ENCOUNTER — Encounter: Payer: Self-pay | Admitting: Cardiology

## 2023-10-31 VITALS — BP 120/70 | HR 84 | Ht 68.0 in | Wt 169.4 lb

## 2023-10-31 DIAGNOSIS — I6521 Occlusion and stenosis of right carotid artery: Secondary | ICD-10-CM | POA: Diagnosis not present

## 2023-10-31 DIAGNOSIS — R918 Other nonspecific abnormal finding of lung field: Secondary | ICD-10-CM | POA: Diagnosis not present

## 2023-10-31 DIAGNOSIS — I351 Nonrheumatic aortic (valve) insufficiency: Secondary | ICD-10-CM | POA: Diagnosis not present

## 2023-10-31 DIAGNOSIS — R9431 Abnormal electrocardiogram [ECG] [EKG]: Secondary | ICD-10-CM | POA: Diagnosis not present

## 2023-10-31 DIAGNOSIS — I10 Essential (primary) hypertension: Secondary | ICD-10-CM

## 2023-10-31 NOTE — Progress Notes (Unsigned)
Cardiology Office Note:    Date:  10/31/2023   ID:  Janice Randolph, DOB 08-Jun-1951, MRN [id number]  PCP:  Erskine Emery, NP  Cardiologist:  Gypsy Balsam, MD    Referring MD: Erskine Emery, NP   Chief Complaint  Patient presents with   Follow-up    History of Present Illness:    Janice Randolph is a 72 y.o. female past medical history significant for abnormal EKG suggesting for old myocardial infarction however after that echocardiogram showed preserved left ventricle ejection fraction.  She also have carotic arterial disease with 50 to 69% stenosis on the right, essential hypertension, dyslipidemia she never smoked but she was not smoke environment.  She did have coronary calcium score done which is 0 she comes today to months for follow-up we will doing well.  Denies any chest pain tightness squeezing pressure burning chest.  Still trying to be active and walks.  Last time when we did her calcium score she did have some pulmonary nodules that need to be follow-up  History reviewed. No pertinent past medical history.  Past Surgical History:  Procedure Laterality Date   ABDOMINAL HYSTERECTOMY      Current Medications: Current Meds  Medication Sig   amLODipine (NORVASC) 2.5 MG tablet Take 2.5 mg by mouth daily.   aspirin EC 81 MG tablet Take 81 mg by mouth daily.   atorvastatin (LIPITOR) 20 MG tablet Take 1 tablet (20 mg total) by mouth daily.   estradiol (ESTRACE) 1 MG tablet Take 1 mg by mouth daily.   hydrochlorothiazide (HYDRODIURIL) 25 MG tablet Take 25 mg by mouth daily.     Allergies:   Lactose, Lactose intolerance (gi), and Tilactase   Social History   Socioeconomic History   Marital status: Married    Spouse name: Not on file   Number of children: Not on file   Years of education: Not on file   Highest education level: Not on file  Occupational History   Not on file  Tobacco Use   Smoking status: Never   Smokeless tobacco: Never  Substance and  Sexual Activity   Alcohol use: Never   Drug use: Never   Sexual activity: Not Currently  Other Topics Concern   Not on file  Social History Narrative   Not on file   Social Drivers of Health   Financial Resource Strain: Not on file  Food Insecurity: Not on file  Transportation Needs: Not on file  Physical Activity: Not on file  Stress: Not on file  Social Connections: Not on file     Family History: The patient's family history is not on file. ROS:   Please see the history of present illness.    All 14 point review of systems negative except as described per history of present illness  EKGs/Labs/Other Studies Reviewed:    EKG Interpretation Date/Time:  Monday October 31 2023 14:27:03 EST Ventricular Rate:  84 PR Interval:  158 QRS Duration:  70 QT Interval:  362 QTC Calculation: 427 R Axis:   -29  Text Interpretation: Sinus rhythm with Fusion complexes Right atrial enlargement Low voltage QRS Inferior infarct , age undetermined Cannot rule out Anterior infarct , age undetermined Abnormal ECG No previous ECGs available Confirmed by Gypsy Balsam [phone]) on 10/31/2023 2:34:45 PM    Recent Labs: 11/25/2022: ALT 17  Recent Lipid Panel    Component Value Date/Time   CHOL 137 11/25/2022 1023   TRIG 193 (H) 11/25/2022 1023  HDL 39 (L) 11/25/2022 1023   CHOLHDL 3.5 11/25/2022 1023   LDLCALC 66 11/25/2022 1023    Physical Exam:    VS:  BP 120/70 (BP Location: Left Arm, Patient Position: Sitting)   Pulse 84   Ht 5\' 8"  (1.727 m)   Wt 169 lb 6.4 oz (76.8 kg)   SpO2 99%   BMI 25.76 kg/m     Wt Readings from Last 3 Encounters:  10/31/23 169 lb 6.4 oz (76.8 kg)  11/30/22 172 lb (78 kg)  08/27/22 172 lb 6.4 oz (78.2 kg)     GEN:  Well nourished, well developed in no acute distress HEENT: Normal NECK: No JVD; No carotid bruits LYMPHATICS: No lymphadenopathy CARDIAC: RRR, no murmurs, no rubs, no gallops RESPIRATORY:  Clear to auscultation without rales,  wheezing or rhonchi  ABDOMEN: Soft, non-tender, non-distended MUSCULOSKELETAL:  No edema; No deformity  SKIN: Warm and dry LOWER EXTREMITIES: no swelling NEUROLOGIC:  Alert and oriented x 3 PSYCHIATRIC:  Normal affect   ASSESSMENT:    1. Essential hypertension   2. Stenosis of right carotid artery   3. Nonrheumatic aortic valve insufficiency   4. Nonspecific abnormal electrocardiogram (ECG) (EKG)    PLAN:    In order of problems listed above:  Essential hypertension blood pressure well-controlled continue present management. Dyslipidemia cholesterol is well-controlled with LDL of 63 HDL 35 will continue present management which includes Lipitor 20. 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 Pulmonary nodule noted on CT for calcium score will repeat CT   Medication Adjustments/Labs and Tests Ordered: Current medicines are reviewed at length with the patient today.  Concerns regarding medicines are outlined above.  Orders Placed This Encounter  Procedures   EKG 12-Lead   Medication changes: No orders of the defined types were placed in this encounter.   Signed, Georgeanna Lea, MD, [facility] 10/31/2023 2:49 PM    [facility]

## 2023-10-31 NOTE — Patient Instructions (Addendum)
Medication Instructions:  Your physician recommends that you continue on your current medications as directed. Please refer to the Current Medication list given to you today.  *If you need a refill on your cardiac medications before your next appointment, please call your pharmacy*   Lab Work: None Ordered If you have labs (blood work) drawn today and your tests are completely normal, you will receive your results only by: MyChart Message (if you have MyChart) OR A paper copy in the mail If you have any lab test that is abnormal or we need to change your treatment, we will call you to review the results.   Testing/Procedures: Your physician has requested that you have a carotid duplex. This test is an ultrasound of the carotid arteries in your neck. It looks at blood flow through these arteries that supply the brain with blood. Allow one hour for this exam. There are no restrictions or special instructions.    Follow-Up: At [facility], you and your health needs are our priority.  As part of our continuing mission to provide you with exceptional heart care, we have created designated Provider Care Teams.  These Care Teams include your primary Cardiologist (physician) and Advanced Practice Providers (APPs -  Physician Assistants and Nurse Practitioners) who all work together to provide you with the care you need, when you need it.  We recommend signing up for the patient portal called "MyChart".  Sign up information is provided on this After Visit Summary.  MyChart is used to connect with patients for Virtual Visits (Telemedicine).  Patients are able to view lab/test results, encounter notes, upcoming appointments, etc.  Non-urgent messages can be sent to your provider as well.   To learn more about what you can do with MyChart, go to [email or web address].    Your next appointment:   12 month(s)  The format for your next appointment:   In Person  Provider:   Robert Krasowski, MD     Other Instructions NA  

## 2023-11-03 NOTE — Addendum Note (Signed)
Addended by: Baldo Ash D on: 11/03/2023 10:03 AM   Modules accepted: Orders

## 2023-11-08 ENCOUNTER — Other Ambulatory Visit: Payer: Self-pay | Admitting: Cardiology

## 2023-11-11 DIAGNOSIS — R918 Other nonspecific abnormal finding of lung field: Secondary | ICD-10-CM | POA: Diagnosis not present

## 2023-11-14 DIAGNOSIS — Z008 Encounter for other general examination: Secondary | ICD-10-CM | POA: Diagnosis not present

## 2023-11-14 DIAGNOSIS — R918 Other nonspecific abnormal finding of lung field: Secondary | ICD-10-CM | POA: Diagnosis not present

## 2023-11-22 ENCOUNTER — Ambulatory Visit: Payer: Medicare HMO | Attending: Cardiology

## 2023-11-22 ENCOUNTER — Telehealth: Payer: Self-pay | Admitting: Cardiology

## 2023-11-22 DIAGNOSIS — I6521 Occlusion and stenosis of right carotid artery: Secondary | ICD-10-CM | POA: Diagnosis not present

## 2023-11-22 NOTE — Telephone Encounter (Signed)
 This patient would like someone to call her with CT Results. She had a CT completed at [facility].

## 2023-11-23 NOTE — Telephone Encounter (Signed)
 LVM per DPR- per Dr. Tonja Fray note regarding normal CT results. Encouraged to call with any questions. Routed to PCP.

## 2023-12-02 ENCOUNTER — Telehealth: Payer: Self-pay

## 2023-12-02 NOTE — Telephone Encounter (Signed)
-----   Message from Gypsy Balsam sent at 11/24/2023 12:00 PM EST ----- Carotic arteries were normal

## 2023-12-02 NOTE — Telephone Encounter (Signed)
Patient notified through my chart.

## 2023-12-08 ENCOUNTER — Encounter: Payer: Self-pay | Admitting: Cardiology

## 2024-01-09 DIAGNOSIS — H524 Presbyopia: Secondary | ICD-10-CM | POA: Diagnosis not present

## 2024-01-09 DIAGNOSIS — H5203 Hypermetropia, bilateral: Secondary | ICD-10-CM | POA: Diagnosis not present

## 2024-01-09 DIAGNOSIS — H2513 Age-related nuclear cataract, bilateral: Secondary | ICD-10-CM | POA: Diagnosis not present

## 2024-03-09 DIAGNOSIS — G8929 Other chronic pain: Secondary | ICD-10-CM | POA: Diagnosis not present

## 2024-03-09 DIAGNOSIS — M25562 Pain in left knee: Secondary | ICD-10-CM | POA: Diagnosis not present

## 2024-03-22 DIAGNOSIS — E559 Vitamin D deficiency, unspecified: Secondary | ICD-10-CM | POA: Diagnosis not present

## 2024-03-22 DIAGNOSIS — I10 Essential (primary) hypertension: Secondary | ICD-10-CM | POA: Diagnosis not present

## 2024-03-22 DIAGNOSIS — Z0000 Encounter for general adult medical examination without abnormal findings: Secondary | ICD-10-CM | POA: Diagnosis not present

## 2024-03-22 DIAGNOSIS — E785 Hyperlipidemia, unspecified: Secondary | ICD-10-CM | POA: Diagnosis not present

## 2024-04-17 DIAGNOSIS — H2513 Age-related nuclear cataract, bilateral: Secondary | ICD-10-CM | POA: Diagnosis not present

## 2024-04-17 DIAGNOSIS — H25013 Cortical age-related cataract, bilateral: Secondary | ICD-10-CM | POA: Diagnosis not present

## 2024-04-17 DIAGNOSIS — H2511 Age-related nuclear cataract, right eye: Secondary | ICD-10-CM | POA: Diagnosis not present

## 2024-04-17 DIAGNOSIS — H18413 Arcus senilis, bilateral: Secondary | ICD-10-CM | POA: Diagnosis not present

## 2024-04-17 DIAGNOSIS — H25043 Posterior subcapsular polar age-related cataract, bilateral: Secondary | ICD-10-CM | POA: Diagnosis not present

## 2024-06-14 DIAGNOSIS — H04122 Dry eye syndrome of left lacrimal gland: Secondary | ICD-10-CM | POA: Diagnosis not present

## 2024-06-14 DIAGNOSIS — H1789 Other corneal scars and opacities: Secondary | ICD-10-CM | POA: Diagnosis not present

## 2024-06-14 DIAGNOSIS — H18831 Recurrent erosion of cornea, right eye: Secondary | ICD-10-CM | POA: Diagnosis not present

## 2024-08-23 DIAGNOSIS — Z0120 Encounter for dental examination and cleaning without abnormal findings: Secondary | ICD-10-CM | POA: Diagnosis not present

## 2024-08-30 DIAGNOSIS — M1712 Unilateral primary osteoarthritis, left knee: Secondary | ICD-10-CM | POA: Diagnosis not present

## 2024-09-17 DIAGNOSIS — H2511 Age-related nuclear cataract, right eye: Secondary | ICD-10-CM | POA: Diagnosis not present

## 2024-09-17 DIAGNOSIS — H5371 Glare sensitivity: Secondary | ICD-10-CM | POA: Diagnosis not present

## 2024-09-18 DIAGNOSIS — H2512 Age-related nuclear cataract, left eye: Secondary | ICD-10-CM | POA: Diagnosis not present

## 2024-10-08 DIAGNOSIS — H5371 Glare sensitivity: Secondary | ICD-10-CM | POA: Diagnosis not present

## 2024-10-08 DIAGNOSIS — H2512 Age-related nuclear cataract, left eye: Secondary | ICD-10-CM | POA: Diagnosis not present

## 2024-10-08 DIAGNOSIS — H25012 Cortical age-related cataract, left eye: Secondary | ICD-10-CM | POA: Diagnosis not present

## 2024-10-08 DIAGNOSIS — H25042 Posterior subcapsular polar age-related cataract, left eye: Secondary | ICD-10-CM | POA: Diagnosis not present

## 2024-10-10 ENCOUNTER — Other Ambulatory Visit: Payer: Self-pay | Admitting: Cardiology

## 2024-11-28 ENCOUNTER — Encounter: Payer: Self-pay | Admitting: Cardiology

## 2024-11-28 ENCOUNTER — Ambulatory Visit: Payer: Medicare (Managed Care) | Attending: Cardiology | Admitting: Cardiology

## 2024-11-28 VITALS — BP 150/86 | HR 83 | Ht 68.0 in | Wt 165.0 lb

## 2024-11-28 DIAGNOSIS — I351 Nonrheumatic aortic (valve) insufficiency: Secondary | ICD-10-CM

## 2024-11-28 DIAGNOSIS — I10 Essential (primary) hypertension: Secondary | ICD-10-CM | POA: Diagnosis not present

## 2024-11-28 DIAGNOSIS — I6521 Occlusion and stenosis of right carotid artery: Secondary | ICD-10-CM | POA: Diagnosis not present

## 2024-11-28 DIAGNOSIS — R011 Cardiac murmur, unspecified: Secondary | ICD-10-CM

## 2024-11-28 MED ORDER — AMLODIPINE BESYLATE 5 MG PO TABS: 5.0000 mg | ORAL_TABLET | Freq: Every day | ORAL | 3 refills | Status: AC

## 2024-11-28 NOTE — Patient Instructions (Signed)
 Medication Instructions:  Your physician has recommended you make the following change in your medication:  Increase Amlodipine  to 5 mg daily   *If you need a refill on your cardiac medications before your next appointment, please call your pharmacy*   Lab Work: None ordered If you have labs (blood work) drawn today and your tests are completely normal, you will receive your results only by: MyChart Message (if you have MyChart) OR A paper copy in the mail If you have any lab test that is abnormal or we need to change your treatment, we will call you to review the results.  Testing/Procedures: Your physician has requested that you have an echocardiogram. Echocardiography is a painless test that uses sound waves to create images of your heart. It provides your doctor with information about the size and shape of your heart and how well your hearts chambers and valves are working. This procedure takes approximately one hour. There are no restrictions for this procedure. Please do NOT wear cologne, perfume, aftershave, or lotions (deodorant is allowed). Please arrive 15 minutes prior to your appointment time.  Please note: We ask at that you not bring children with you during ultrasound (echo/ vascular) testing. Due to room size and safety concerns, children are not allowed in the ultrasound rooms during exams. Our front office staff cannot provide observation of children in our lobby area while testing is being conducted. An adult accompanying a patient to their appointment will only be allowed in the ultrasound room at the discretion of the ultrasound technician under special circumstances. We apologize for any inconvenience.  Follow-Up: At [facility], you and your health needs are our priority.  As part of our continuing mission to provide you with exceptional heart care, we have created designated Provider Care Teams.  These Care Teams include your primary Cardiologist (physician) and  Advanced Practice Providers (APPs -  Physician Assistants and Nurse Practitioners) who all work together to provide you with the care you need, when you need it.  We recommend signing up for the patient portal called MyChart.  Sign up information is provided on this After Visit Summary.  MyChart is used to connect with patients for Virtual Visits (Telemedicine).  Patients are able to view lab/test results, encounter notes, upcoming appointments, etc.  Non-urgent messages can be sent to your provider as well.   To learn more about what you can do with MyChart, go to [email or web address].    Your next appointment:   1 year(s)  The format for your next appointment:   In Person  Provider:   Lamar Fitch, MD   Other Instructions Echocardiogram An echocardiogram is a test that uses sound waves (ultrasound) to produce images of the heart. Images from an echocardiogram can provide important information about: Heart size and shape. The size and thickness and movement of your heart's walls. Heart muscle function and strength. Heart valve function or if you have stenosis. Stenosis is when the heart valves are too narrow. If blood is flowing backward through the heart valves (regurgitation). A tumor or infectious growth around the heart valves. Areas of heart muscle that are not working well because of poor blood flow or injury from a heart attack. Aneurysm detection. An aneurysm is a weak or damaged part of an artery wall. The wall bulges out from the normal force of blood pumping through the body. Tell a health care provider about: Any allergies you have. All medicines you are taking, including vitamins, herbs, eye drops, creams,  and over-the-counter medicines. Any blood disorders you have. Any surgeries you have had. Any medical conditions you have. Whether you are pregnant or may be pregnant. What are the risks? Generally, this is a safe test. However, problems may occur,  including an allergic reaction to dye (contrast) that may be used during the test. What happens before the test? No specific preparation is needed. You may eat and drink normally. What happens during the test? You will take off your clothes from the waist up and put on a hospital gown. Electrodes or electrocardiogram (ECG)patches may be placed on your chest. The electrodes or patches are then connected to a device that monitors your heart rate and rhythm. You will lie down on a table for an ultrasound exam. A gel will be applied to your chest to help sound waves pass through your skin. A handheld device, called a transducer, will be pressed against your chest and moved over your heart. The transducer produces sound waves that travel to your heart and bounce back (or echo back) to the transducer. These sound waves will be captured in real-time and changed into images of your heart that can be viewed on a video monitor. The images will be recorded on a computer and reviewed by your health care provider. You may be asked to change positions or hold your breath for a short time. This makes it easier to get different views or better views of your heart. In some cases, you may receive contrast through an IV in one of your veins. This can improve the quality of the pictures from your heart. The procedure may vary among health care providers and hospitals.   What can I expect after the test? You may return to your normal, everyday life, including diet, activities, and medicines, unless your health care provider tells you not to do that. Follow these instructions at home: It is up to you to get the results of your test. Ask your health care provider, or the department that is doing the test, when your results will be ready. Keep all follow-up visits. This is important. Summary An echocardiogram is a test that uses sound waves (ultrasound) to produce images of the heart. Images from an echocardiogram can  provide important information about the size and shape of your heart, heart muscle function, heart valve function, and other possible heart problems. You do not need to do anything to prepare before this test. You may eat and drink normally. After the echocardiogram is completed, you may return to your normal, everyday life, unless your health care provider tells you not to do that. This information is not intended to replace advice given to you by your health care provider. Make sure you discuss any questions you have with your health care provider. Document Revised: 06/17/2020 Document Reviewed: 06/17/2020 Elsevier Patient Education  2021 Elsevier Inc.   Important Information About Sugar

## 2024-11-28 NOTE — Progress Notes (Signed)
 " Cardiology Office Note:    Date:  11/28/2024   ID:  AKASHA MELENA, DOB [birth year], MRN [id number]  PCP:  Silvano Angeline FALCON, NP  Cardiologist:  Lamar Fitch, MD    Referring MD: Silvano Angeline FALCON, NP   Chief Complaint  Patient presents with   Annual Exam  Doing fine  History of Present Illness:     MARJIE CHEA is a 74 y.o. female past medical history significant for carotic arterial disease interestingly previously documented 50 to 69% stenosis by last carotid ultrasound show biopsy none.  Next year we will probably repeat it again to verify that.  Denies having any chest pain tightness squeezing pressure burning chest does have essential hypertension which seems to be somewhat uncontrolled.  Still very active able to walk climb stairs with no difficulties  Past Medical History:  Diagnosis Date   Aortic insufficiency only mild 08/27/2022   Carotid arterial disease (HCC) 50 to 69% stenosis in distal right intracardiac artery, 08/27/2022   Essential hypertension 05/05/2020   Nonspecific abnormal electrocardiogram (ECG) (EKG) 08/27/2022    Past Surgical History:  Procedure Laterality Date   ABDOMINAL HYSTERECTOMY      Current Medications: Active Medications[1]   Allergies:   Lactose, Lactose intolerance (gi), and Tilactase   Social History   Socioeconomic History   Marital status: Married    Spouse name: Not on file   Number of children: Not on file   Years of education: Not on file   Highest education level: Not on file  Occupational History   Not on file  Tobacco Use   Smoking status: Never   Smokeless tobacco: Never  Substance and Sexual Activity   Alcohol use: Never   Drug use: Never   Sexual activity: Not Currently  Other Topics Concern   Not on file  Social History Narrative   Not on file   Social Drivers of Health   Tobacco Use: Low Risk (11/28/2024)   Patient History    Smoking Tobacco Use: Never    Smokeless Tobacco Use: Never    Passive  Exposure: Not on file  Financial Resource Strain: Not on file  Food Insecurity: Not on file  Transportation Needs: Not on file  Physical Activity: Not on file  Stress: Not on file  Social Connections: Not on file  Depression (EYV7-0): Not on file  Alcohol Screen: Not on file  Housing: Not on file  Utilities: Not on file  Health Literacy: Not on file     Family History: The patient's family history is not on file. ROS:   Please see the history of present illness.    All 14 point review of systems negative except as described per history of present illness  EKGs/Labs/Other Studies Reviewed:         Recent Labs: No results found for requested labs within last 365 days.  Recent Lipid Panel    Component Value Date/Time   CHOL 137 11/25/2022 1023   TRIG 193 (H) 11/25/2022 1023   HDL 39 (L) 11/25/2022 1023   CHOLHDL 3.5 11/25/2022 1023   LDLCALC 66 11/25/2022 1023    Physical Exam:    VS:  BP (!) 150/86   Pulse 83   Ht 5' 8 (1.727 m)   Wt 165 lb (74.8 kg)   SpO2 99%   BMI 25.09 kg/m     Wt Readings from Last 3 Encounters:  11/28/24 165 lb (74.8 kg)  10/31/23 169 lb 6.4 oz (76.8 kg)  11/30/22 172 lb (78 kg)     GEN:  Well nourished, well developed in no acute distress HEENT: Normal NECK: No JVD; No carotid bruits LYMPHATICS: No lymphadenopathy CARDIAC: RRR, no murmurs, no rubs, no gallops RESPIRATORY:  Clear to auscultation without rales, wheezing or rhonchi  ABDOMEN: Soft, non-tender, non-distended MUSCULOSKELETAL:  No edema; No deformity  SKIN: Warm and dry LOWER EXTREMITIES: no swelling NEUROLOGIC:  Alert and oriented x 3 PSYCHIATRIC:  Normal affect   ASSESSMENT:    1. Essential hypertension   2. Stenosis of right carotid artery   3. Nonrheumatic aortic valve insufficiency    PLAN:    In order of problems listed above:  Essential hypertension uncontrolled we will double the dose of amlodipine  she takes 2.5 mg we will go to 5 mg daily. Carotic  artery stenosis again last carotic ultrasound showed absolutely no stenosis next year we will recheck it. Heart murmur very soft previously known aortic insufficiency but this 1 look like aortic stenosis.  Will repeat echocardiogram recheck of that. Overall we did talk about healthy lifestyle need to exercise on the regular basis.   Medication Adjustments/Labs and Tests Ordered: Current medicines are reviewed at length with the patient today.  Concerns regarding medicines are outlined above.  Orders Placed This Encounter  Procedures   EKG 12-Lead   Medication changes: No orders of the defined types were placed in this encounter.   Signed, Lamar DOROTHA Fitch, MD, [facility] 11/28/2024 10:42 AM    [facility]    [1]  Current Meds  Medication Sig   amLODipine  (NORVASC ) 2.5 MG tablet Take 2.5 mg by mouth daily.   aspirin EC 81 MG tablet Take 81 mg by mouth daily.   atorvastatin  (LIPITOR) 20 MG tablet TAKE 1 TABLET BY MOUTH EVERY DAY   hydrochlorothiazide (HYDRODIURIL) 25 MG tablet Take 25 mg by mouth daily.   "

## 2024-11-28 NOTE — Addendum Note (Signed)
 Addended by: GLENFORD ALAN CROME on: 11/28/2024 10:48 AM   Modules accepted: Orders

## 2024-12-25 ENCOUNTER — Ambulatory Visit: Payer: Medicare (Managed Care)
